# Patient Record
Sex: Male | Born: 1985 | Hispanic: No | Marital: Single | Smoking: Never smoker
Health system: Southern US, Community
[De-identification: ages and names within clinical notes are randomized; demographics above are authoritative.]

## PROBLEM LIST (undated history)

## (undated) ENCOUNTER — Ambulatory Visit: Payer: Self-pay | Source: Home / Self Care

---

## 2008-11-22 ENCOUNTER — Emergency Department (HOSPITAL_COMMUNITY): Admission: EM | Admit: 2008-11-22 | Discharge: 2008-11-22 | Payer: Self-pay | Admitting: Emergency Medicine

## 2008-11-24 ENCOUNTER — Emergency Department (HOSPITAL_COMMUNITY): Admission: EM | Admit: 2008-11-24 | Discharge: 2008-11-24 | Payer: Self-pay | Admitting: Emergency Medicine

## 2008-12-10 ENCOUNTER — Emergency Department (HOSPITAL_COMMUNITY): Admission: EM | Admit: 2008-12-10 | Discharge: 2008-12-10 | Payer: Self-pay | Admitting: Emergency Medicine

## 2009-12-20 IMAGING — CR DG CHEST 2V
2 series · 2 of 2 positions shown · non-contrast
Comparison: None

CLINICAL DATA: Cough, difficulty sleeping.

CHEST - 2 VIEW

[w chest pa]
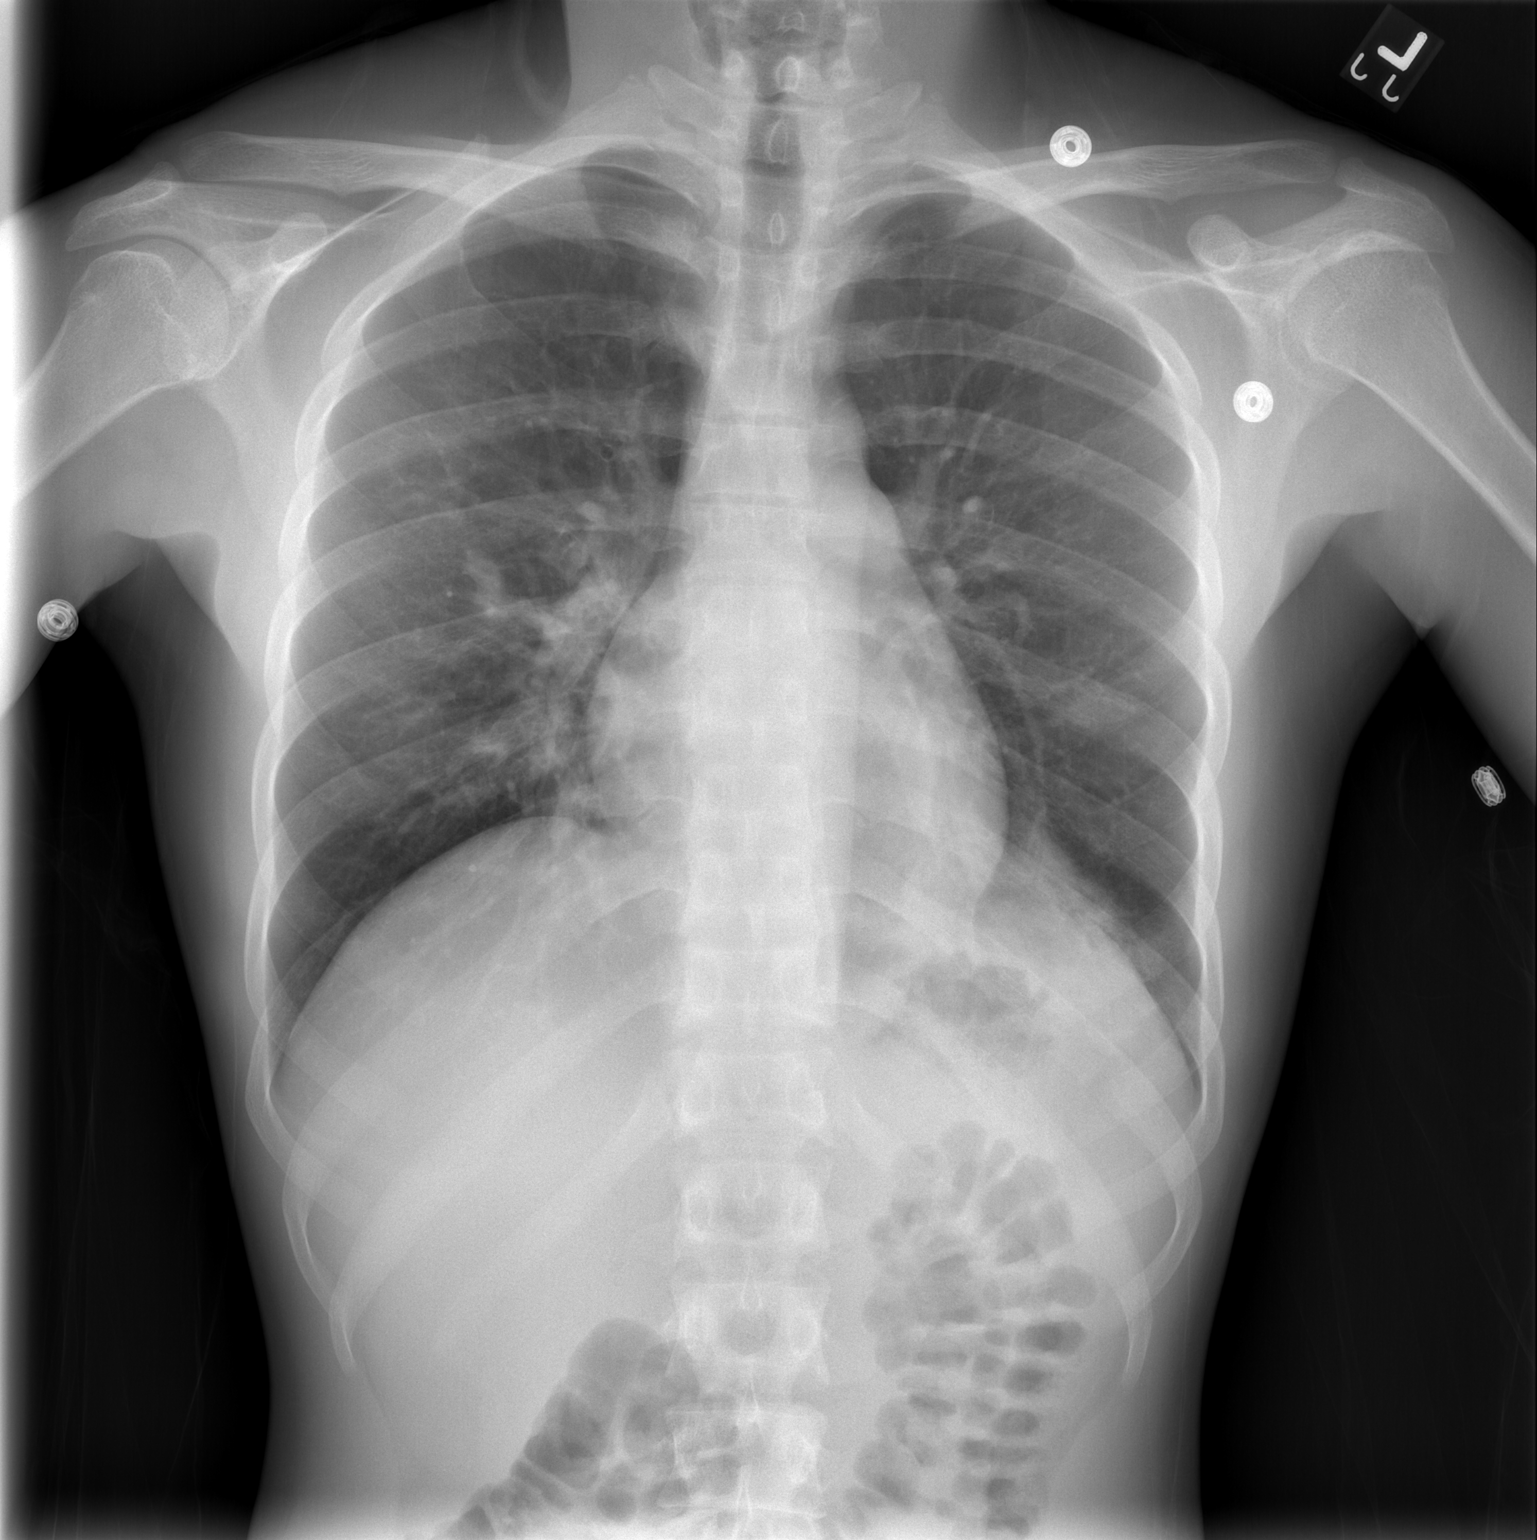

[w chest lat]
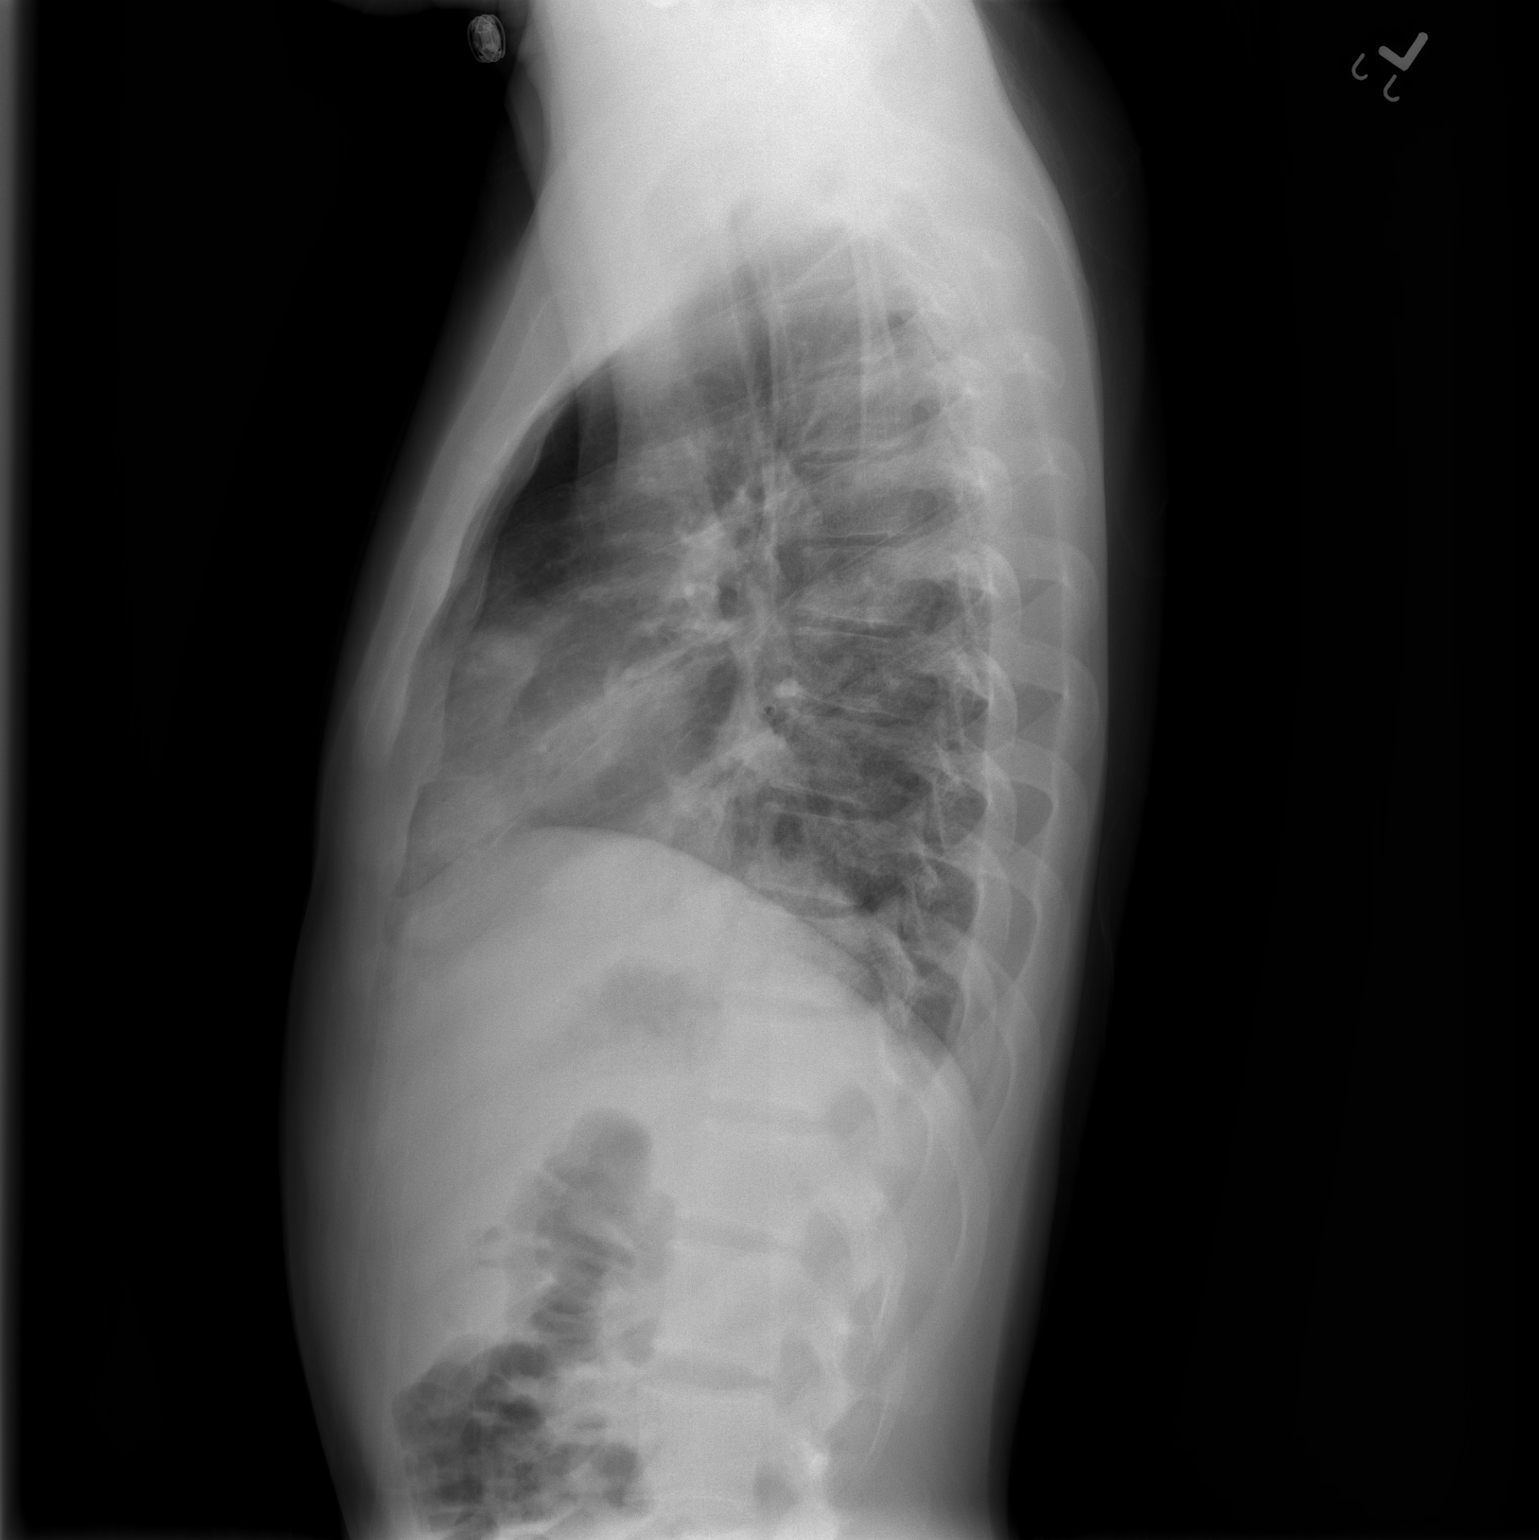

[2 of 2 positions shown; findings below may reference images not displayed]

FINDINGS: Air space disease is seen in the lower lung zones
bilaterally, and appears worst in the left lower lobe.  Lungs
otherwise clear.  No pleural fluid.  Visualized upper abdomen
unremarkable.
IMPRESSION: Pulmonary parenchymal findings are most consistent with bibasilar
pneumonia.

## 2010-04-23 ENCOUNTER — Inpatient Hospital Stay (HOSPITAL_COMMUNITY): Admission: RE | Admit: 2010-04-23 | Discharge: 2010-04-29 | Payer: Self-pay | Admitting: Psychiatry

## 2010-04-23 ENCOUNTER — Ambulatory Visit: Payer: Self-pay | Admitting: Psychiatry

## 2010-04-23 ENCOUNTER — Emergency Department (HOSPITAL_COMMUNITY): Admission: EM | Admit: 2010-04-23 | Discharge: 2010-04-23 | Payer: Self-pay | Admitting: Emergency Medicine

## 2010-05-08 ENCOUNTER — Emergency Department (HOSPITAL_COMMUNITY): Admission: EM | Admit: 2010-05-08 | Discharge: 2010-05-11 | Payer: Self-pay | Admitting: Emergency Medicine

## 2010-05-30 ENCOUNTER — Ambulatory Visit: Payer: Self-pay | Admitting: Psychiatry

## 2011-02-12 LAB — POCT I-STAT, CHEM 8
Chloride: 106 mEq/L (ref 96–112)
Glucose, Bld: 97 mg/dL (ref 70–99)
HCT: 51 % (ref 39.0–52.0)
Hemoglobin: 17.3 g/dL — ABNORMAL HIGH (ref 13.0–17.0)
Potassium: 3.7 mEq/L (ref 3.5–5.1)
Sodium: 140 mEq/L (ref 135–145)

## 2011-02-12 LAB — URINALYSIS, ROUTINE W REFLEX MICROSCOPIC
Bilirubin Urine: NEGATIVE
Nitrite: NEGATIVE
Specific Gravity, Urine: 1.018 (ref 1.005–1.030)
Urobilinogen, UA: 1 mg/dL (ref 0.0–1.0)

## 2011-02-12 LAB — RAPID URINE DRUG SCREEN, HOSP PERFORMED
Amphetamines: NOT DETECTED
Amphetamines: NOT DETECTED
Barbiturates: NOT DETECTED
Opiates: NOT DETECTED
Tetrahydrocannabinol: NOT DETECTED

## 2011-02-12 LAB — HEPATIC FUNCTION PANEL
ALT: 18 U/L (ref 0–53)
AST: 32 U/L (ref 0–37)
Bilirubin, Direct: 0.1 mg/dL (ref 0.0–0.3)
Total Bilirubin: 1.1 mg/dL (ref 0.3–1.2)

## 2011-02-12 LAB — ETHANOL: Alcohol, Ethyl (B): <5 mg/dL (ref 0–10)

## 2011-02-12 LAB — BASIC METABOLIC PANEL
CO2: 28 mEq/L (ref 19–32)
Calcium: 9.6 mg/dL (ref 8.4–10.5)
Creatinine, Ser: 1.18 mg/dL (ref 0.4–1.5)
GFR calc Af Amer: >60 mL/min (ref >60)
GFR calc non Af Amer: >60 mL/min (ref >60)

## 2011-02-12 LAB — CBC
MCHC: 33.9 g/dL (ref 30.0–36.0)
RBC: 4.96 MIL/uL (ref 4.22–5.81)
RDW: 11.9 % (ref 11.5–15.5)

## 2011-08-31 LAB — HEPATIC FUNCTION PANEL
Indirect Bilirubin: 1.1 mg/dL — ABNORMAL HIGH (ref 0.3–0.9)
Total Protein: 7.3 g/dL (ref 6.0–8.3)

## 2011-08-31 LAB — CBC
HCT: 44.5 % (ref 39.0–52.0)
MCV: 94.5 fL (ref 78.0–100.0)
Platelets: 143 10*3/uL — ABNORMAL LOW (ref 150–400)
RDW: 12.1 % (ref 11.5–15.5)

## 2011-08-31 LAB — DIFFERENTIAL
Basophils Absolute: 0 10*3/uL (ref 0.0–0.1)
Eosinophils Absolute: 0 10*3/uL (ref 0.0–0.7)
Eosinophils Relative: 0 % (ref 0–5)

## 2011-08-31 LAB — LIPASE, BLOOD: Lipase: 19 U/L (ref 11–59)

## 2012-06-30 ENCOUNTER — Emergency Department (HOSPITAL_COMMUNITY)
Admission: EM | Admit: 2012-06-30 | Discharge: 2012-06-30 | Disposition: A | Discharge status: Home / Self Care | Payer: Self-pay | Attending: Emergency Medicine | Admitting: Emergency Medicine

## 2012-06-30 ENCOUNTER — Encounter (HOSPITAL_COMMUNITY): Payer: Self-pay | Admitting: *Deleted

## 2012-06-30 DIAGNOSIS — J342 Deviated nasal septum: Secondary | ICD-10-CM | POA: Insufficient documentation

## 2012-06-30 MED ORDER — IBUPROFEN 800 MG PO TABS: 800.0000 mg | ORAL_TABLET | Freq: Three times a day (TID) | ORAL | Status: AC | PRN
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
## 2012-07-01 NOTE — ED Provider Notes (Signed)
Medical screening examination/treatment/procedure(s) were performed by non-physician practitioner and as supervising physician I was immediately available for consultation/collaboration.  Sana Tessmer, MD 07/01/12 1252 

## 2014-01-17 ENCOUNTER — Emergency Department (HOSPITAL_COMMUNITY)
Admission: EM | Admit: 2014-01-17 | Discharge: 2014-01-17 | Disposition: A | Discharge status: Home / Self Care | Payer: Self-pay | Attending: Emergency Medicine | Admitting: Emergency Medicine

## 2014-01-17 ENCOUNTER — Encounter (HOSPITAL_COMMUNITY): Payer: Self-pay | Admitting: Emergency Medicine

## 2014-01-17 DIAGNOSIS — Z79899 Other long term (current) drug therapy: Secondary | ICD-10-CM | POA: Insufficient documentation

## 2014-01-17 DIAGNOSIS — R21 Rash and other nonspecific skin eruption: Secondary | ICD-10-CM | POA: Insufficient documentation

## 2014-01-17 DIAGNOSIS — A64 Unspecified sexually transmitted disease: Secondary | ICD-10-CM

## 2014-01-17 DIAGNOSIS — J029 Acute pharyngitis, unspecified: Secondary | ICD-10-CM | POA: Insufficient documentation

## 2014-01-17 DIAGNOSIS — R197 Diarrhea, unspecified: Secondary | ICD-10-CM | POA: Insufficient documentation

## 2014-01-17 DIAGNOSIS — A510 Primary genital syphilis: Secondary | ICD-10-CM | POA: Insufficient documentation

## 2014-01-17 LAB — RPR: RPR Ser Ql: NONREACTIVE

## 2014-01-17 MED ORDER — AZITHROMYCIN 250 MG PO TABS
1000.0000 mg | ORAL_TABLET | Freq: Once | ORAL | Status: AC
  Administered 2014-01-17: 1000 mg via ORAL
  Filled 2014-01-17: qty 4

## 2014-01-17 MED ORDER — METRONIDAZOLE 500 MG PO TABS
2000.0000 mg | ORAL_TABLET | Freq: Once | ORAL | Status: AC
  Administered 2014-01-17: 2000 mg via ORAL
  Filled 2014-01-17: qty 4

## 2014-01-17 MED ORDER — PENICILLIN G BENZATHINE 1200000 UNIT/2ML IM SUSP
2.4000 10*6.[IU] | Freq: Once | INTRAMUSCULAR | Status: AC
  Administered 2014-01-17: 2.4 10*6.[IU] via INTRAMUSCULAR
  Filled 2014-01-17: qty 4

## 2014-01-17 MED ORDER — CEFIXIME 400 MG PO TABS
400.0000 mg | ORAL_TABLET | Freq: Once | ORAL | Status: AC
  Administered 2014-01-17: 400 mg via ORAL
  Filled 2014-01-17: qty 1

## 2014-01-17 NOTE — ED Notes (Signed)
Pt reports hx of unprotected sex, pt would like lab work, thinks he might have an infection.

## 2014-01-17 NOTE — ED Provider Notes (Signed)
Medical screening examination/treatment/procedure(s) were performed by non-physician practitioner and as supervising physician I was immediately available for consultation/collaboration.   Tukker Byrns T Korrey Schleicher, MD 01/17/14 1535 

## 2014-01-17 NOTE — ED Notes (Signed)
Pt given sandwich to take with medications.

## 2014-01-17 NOTE — ED Notes (Signed)
Pt states he has unprotected sex with women and is afraid he has been exposed to HIV.  Pt believes he is having s/s associated with HIV.  Pt's tongue is coated in white and he has a rash on his right wrist.  Pt also states he is having penile discharge and has a raw place on his penis from masturbating excessively.  Pt also reports some diarrhea.

## 2014-01-17 NOTE — ED Provider Notes (Signed)
CSN: [id number]     Arrival date & time 01/17/14  1302 History  This chart was scribed for non-physician practitioner  Carlyle Dollyhristopher W. Quante Pettry, PA-C, working with Toy BakerAnthony T Allen, MD, by Yevette EdwardsAngela Bracken, ED Scribe. This patient was seen in room WTR9/WTR9 and the patient's care was started at 1:16 PM.   First MD Initiated Contact with Patient 01/17/14 1310     Chief Complaint  Patient presents with  . SEXUALLY TRANSMITTED DISEASE    The history is provided by the patient. No language interpreter was used.   HPI Comments: Scott Friarung Fortune is a 28 y.o. male who presents to the Emergency Department complaining of a possible sexually transmitted infection. The pt has a h/o unprotected sexual intercourse with women. He has experienced an ostensible sore to his tongue and penile discharge yellow in color. He has also experienced a mild sore throat, a rash to his right hand, and mild diarrhea. The pt denies a fever.   No past medical history on file. No past surgical history on file. No family history on file. History  Substance Use Topics  . Smoking status: Never Smoker   . Smokeless tobacco: Not on file  . Alcohol Use: No    Review of Systems  A complete 10 system review of systems was obtained, and all systems were negative except where indicated in the HPI and PE.   Allergies  Review of patient's allergies indicates no known allergies.  Home Medications   Current Outpatient Rx  Name  Route  Sig  Dispense  Refill  . amphetamine-dextroamphetamine (ADDERALL) 20 MG tablet   Oral   Take 20 mg by mouth daily.          Triage Vitals: BP 123/74  Pulse 112  Temp(Src) 98.5 F (36.9 C) (Oral)  Resp 16  SpO2 98%  Physical Exam  Nursing note and vitals reviewed. Constitutional: He is oriented to person, place, and time. He appears well-developed and well-nourished. No distress.  HENT:  Head: Normocephalic and atraumatic.  Mouth/Throat: Oropharynx is clear and moist.  Tongue appeared  normal, though it looked like it needed to be cleaned.  Oropharynx clear.  Eyes: Conjunctivae and EOM are normal. Right eye exhibits no discharge. Left eye exhibits no discharge.  Neck: Normal range of motion. Neck supple. No tracheal deviation present.  Cardiovascular: Normal rate.   Pulmonary/Chest: Effort normal. No respiratory distress.  Genitourinary: Testes normal.    Uncircumcised.  Musculoskeletal: Normal range of motion.  Neurological: He is alert and oriented to person, place, and time.  Skin: Skin is warm and dry. Rash noted.  Dry, scaly rash on dorsum of hands. Palms are clear.   Psychiatric: He has a normal mood and affect. His behavior is normal.    ED Course  Procedures (including critical care time)  DIAGNOSTIC STUDIES: Oxygen Saturation is 98% on room air, normal by my interpretation.    COORDINATION OF CARE:  1:18 PM- Discussed treatment plan with patient, which includes a culture and prophylactic treatment, and the patient agreed to the plan.   1:44 PM- Cultured pt's penile discharge.  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  Patient also be treated for gonorrhea, Chlamydia, along with trichomonas.  Patient is educated on safe sex practices.  Carlyle Dollyhristopher W Shon Mansouri, PA-C 01/17/14 [phone]

## 2014-01-17 NOTE — Discharge Instructions (Signed)
Return here as needed.  Follow-up with the health department °

## 2014-01-18 LAB — GC/CHLAMYDIA PROBE AMP
CT Probe RNA: NEGATIVE
GC Probe RNA: NEGATIVE

## 2014-01-22 ENCOUNTER — Emergency Department (HOSPITAL_COMMUNITY)
Admission: EM | Admit: 2014-01-22 | Discharge: 2014-01-22 | Disposition: A | Discharge status: Home / Self Care | Payer: Self-pay | Attending: Emergency Medicine | Admitting: Emergency Medicine

## 2014-01-22 ENCOUNTER — Encounter (HOSPITAL_COMMUNITY): Payer: Self-pay | Admitting: Emergency Medicine

## 2014-01-22 ENCOUNTER — Telehealth (HOSPITAL_COMMUNITY): Payer: Self-pay

## 2014-01-22 DIAGNOSIS — Z202 Contact with and (suspected) exposure to infections with a predominantly sexual mode of transmission: Secondary | ICD-10-CM | POA: Insufficient documentation

## 2014-01-22 DIAGNOSIS — R21 Rash and other nonspecific skin eruption: Secondary | ICD-10-CM | POA: Insufficient documentation

## 2014-01-22 DIAGNOSIS — Z79899 Other long term (current) drug therapy: Secondary | ICD-10-CM | POA: Insufficient documentation

## 2014-01-22 MED ORDER — ACYCLOVIR 400 MG PO TABS: 400.0000 mg | ORAL_TABLET | Freq: Four times a day (QID) | ORAL | Status: DC

## 2014-01-22 MED ORDER — NYSTATIN 100000 UNIT/GM EX CREA: TOPICAL_CREAM | CUTANEOUS | Status: DC

## 2014-01-22 NOTE — ED Notes (Signed)
Pt ambulatory to exam room with steady gait.  

## 2014-01-22 NOTE — ED Provider Notes (Signed)
Medical screening examination/treatment/procedure(s) were performed by non-physician practitioner and as supervising physician I was immediately available for consultation/collaboration.    Celene KrasJon R Makenlee Mckeag, MD 01/22/14 2124

## 2014-01-22 NOTE — ED Notes (Addendum)
Pt states, "about an hour ago, while masturbating I noticed blood." Pt states the blood was on the tip of his penis. Pt was seen on 01/17/2014 for possible STD exposure. Pt is A/O x4, in NAD, and vitals are WDL.
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
## 2014-01-22 NOTE — Discharge Instructions (Signed)
[facility] and [facility]. Avoid any sexual intercourse or masturbation for the next 2 weeks. Keep your skin clean and dry.   Herpes Simplex Herpes simplex is generally classified as Type 1 or Type 2. Type 1 is generally the type that is responsible for cold sores. Type 2 is generally associated with sexually transmitted diseases. We now know that most of the thoughts on these viruses are inaccurate. We find that HSV1 is also present genitally and HSV2 can be present orally, but this will vary in different locations of the world. Herpes simplex is usually detected by doing a culture. Blood tests are also available for this virus; however, the accuracy is often not as good.  PREPARATION FOR TEST No preparation or fasting is necessary. NORMAL FINDINGS  No virus present  No HSV antigens or antibodies present Ranges for normal findings may vary among different laboratories and hospitals. You should always check with your doctor after having lab work or other tests done to discuss the meaning of your test results and whether your values are considered within normal limits. MEANING OF TEST  Your caregiver will go over the test results with you and discuss the importance and meaning of your results, as well as treatment options and the need for additional tests if necessary. OBTAINING THE TEST RESULTS  It is your responsibility to obtain your test results. Ask the lab or department performing the test when and how you will get your results. Document Released: 12/15/2004 Document Revised: 02/04/2012 Document Reviewed: 10/23/2008 [facility] Patient Information 2014 [place], [place].

## 2014-01-22 NOTE — ED Notes (Signed)
Pt calling for STD results.  ID verified x 2.  Pt informed Gonorrhea and Chlamydia were both negative and RPR was non reactive.

## 2014-05-10 ENCOUNTER — Emergency Department (HOSPITAL_COMMUNITY)
Admission: EM | Admit: 2014-05-10 | Discharge: 2014-05-10 | Disposition: A | Discharge status: Home / Self Care | Payer: Self-pay | Attending: Emergency Medicine | Admitting: Emergency Medicine

## 2014-05-10 ENCOUNTER — Encounter (HOSPITAL_COMMUNITY): Payer: Self-pay | Admitting: Emergency Medicine

## 2014-05-10 DIAGNOSIS — Z043 Encounter for examination and observation following other accident: Secondary | ICD-10-CM | POA: Insufficient documentation

## 2014-05-10 DIAGNOSIS — Y9389 Activity, other specified: Secondary | ICD-10-CM | POA: Insufficient documentation

## 2014-05-10 DIAGNOSIS — Z79899 Other long term (current) drug therapy: Secondary | ICD-10-CM | POA: Insufficient documentation

## 2014-05-10 DIAGNOSIS — F411 Generalized anxiety disorder: Secondary | ICD-10-CM | POA: Insufficient documentation

## 2014-05-10 DIAGNOSIS — Y92410 Unspecified street and highway as the place of occurrence of the external cause: Secondary | ICD-10-CM | POA: Insufficient documentation

## 2014-05-10 DIAGNOSIS — R42 Dizziness and giddiness: Secondary | ICD-10-CM | POA: Insufficient documentation
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
## 2014-05-11 NOTE — ED Provider Notes (Signed)
Medical screening examination/treatment/procedure(s) were performed by non-physician practitioner and as supervising physician I was immediately available for consultation/collaboration.   EKG Interpretation None       Doug SouSam Tamani Durney, MD 05/11/14 [phone]

## 2014-05-23 ENCOUNTER — Encounter (HOSPITAL_COMMUNITY): Payer: Self-pay | Admitting: Emergency Medicine

## 2014-05-23 ENCOUNTER — Emergency Department (HOSPITAL_COMMUNITY)
Admission: EM | Admit: 2014-05-23 | Discharge: 2014-05-23 | Disposition: A | Discharge status: Home / Self Care | Payer: No Typology Code available for payment source | Attending: Emergency Medicine | Admitting: Emergency Medicine

## 2014-05-23 DIAGNOSIS — Q666 Other congenital valgus deformities of feet: Secondary | ICD-10-CM | POA: Insufficient documentation

## 2014-05-23 DIAGNOSIS — K625 Hemorrhage of anus and rectum: Secondary | ICD-10-CM | POA: Insufficient documentation

## 2014-05-23 DIAGNOSIS — R369 Urethral discharge, unspecified: Secondary | ICD-10-CM | POA: Insufficient documentation

## 2014-05-23 DIAGNOSIS — Z202 Contact with and (suspected) exposure to infections with a predominantly sexual mode of transmission: Secondary | ICD-10-CM

## 2014-05-23 DIAGNOSIS — F411 Generalized anxiety disorder: Secondary | ICD-10-CM | POA: Insufficient documentation

## 2014-05-23 DIAGNOSIS — K921 Melena: Secondary | ICD-10-CM | POA: Insufficient documentation

## 2014-05-23 LAB — URINALYSIS, ROUTINE W REFLEX MICROSCOPIC
Bilirubin Urine: NEGATIVE
Glucose, UA: NEGATIVE mg/dL
Hgb urine dipstick: NEGATIVE
Ketones, ur: NEGATIVE mg/dL
LEUKOCYTES UA: NEGATIVE
NITRITE: NEGATIVE
Protein, ur: NEGATIVE mg/dL
SPECIFIC GRAVITY, URINE: 1.021 (ref 1.005–1.030)
UROBILINOGEN UA: 0.2 mg/dL (ref 0.0–1.0)
pH: 7.5 (ref 5.0–8.0)

## 2014-05-23 LAB — CBC WITH DIFFERENTIAL/PLATELET
BASOS ABS: 0 10*3/uL (ref 0.0–0.1)
BASOS PCT: 0 % (ref 0–1)
EOS ABS: 0 10*3/uL (ref 0.0–0.7)
Eosinophils Relative: 1 % (ref 0–5)
HCT: 48.8 % (ref 39.0–52.0)
Hemoglobin: 17.3 g/dL — ABNORMAL HIGH (ref 13.0–17.0)
Lymphocytes Relative: 18 % (ref 12–46)
Lymphs Abs: 1.1 10*3/uL (ref 0.7–4.0)
MCH: 32.2 pg (ref 26.0–34.0)
MCHC: 35.5 g/dL (ref 30.0–36.0)
MCV: 90.7 fL (ref 78.0–100.0)
MONOS PCT: 6 % (ref 3–12)
Monocytes Absolute: 0.4 10*3/uL (ref 0.1–1.0)
NEUTROS ABS: 4.5 10*3/uL (ref 1.7–7.7)
NEUTROS PCT: 75 % (ref 43–77)
PLATELETS: 177 10*3/uL (ref 150–400)
RBC: 5.38 MIL/uL (ref 4.22–5.81)
RDW: 11 % — AB (ref 11.5–15.5)
WBC: 6 10*3/uL (ref 4.0–10.5)

## 2014-05-23 LAB — POC OCCULT BLOOD, ED: Fecal Occult Bld: POSITIVE — AB

## 2014-05-23 LAB — BASIC METABOLIC PANEL
BUN: 14 mg/dL (ref 6–23)
CHLORIDE: 100 meq/L (ref 96–112)
CO2: 27 mEq/L (ref 19–32)
Calcium: 9.2 mg/dL (ref 8.4–10.5)
Creatinine, Ser: 1.02 mg/dL (ref 0.50–1.35)
Glucose, Bld: 91 mg/dL (ref 70–99)
POTASSIUM: 4.1 meq/L (ref 3.7–5.3)
SODIUM: 141 meq/L (ref 137–147)

## 2014-05-23 LAB — RPR

## 2014-05-23 LAB — RAPID HIV SCREEN (WH-MAU): Rapid HIV Screen: NONREACTIVE

## 2014-05-23 MED ORDER — LIDOCAINE HCL 1 % IJ SOLN
INTRAMUSCULAR | Status: AC
  Administered 2014-05-23: 1.6 mL
  Filled 2014-05-23: qty 20

## 2014-05-23 MED ORDER — CEFTRIAXONE SODIUM 250 MG IJ SOLR
125.0000 mg | Freq: Once | INTRAMUSCULAR | Status: AC
  Administered 2014-05-23: 125 mg via INTRAMUSCULAR
  Filled 2014-05-23: qty 250

## 2014-05-23 MED ORDER — AZITHROMYCIN 250 MG PO TABS
1000.0000 mg | ORAL_TABLET | Freq: Once | ORAL | Status: AC
  Administered 2014-05-23: 1000 mg via ORAL
  Filled 2014-05-23: qty 4

## 2014-05-23 MED ORDER — METRONIDAZOLE 500 MG PO TABS
2000.0000 mg | ORAL_TABLET | Freq: Once | ORAL | Status: AC
  Administered 2014-05-23: 2000 mg via ORAL
  Filled 2014-05-23: qty 4

## 2014-05-23 NOTE — ED Provider Notes (Signed)
CSN: [id number]     Arrival date & time 05/23/14  1241 History   First MD Initiated Contact with Patient 05/23/14 1353     No chief complaint on file.    (Consider location/radiation/quality/duration/timing/severity/associated sxs/prior Treatment) The history is provided by the patient.   28 year old male presents emergency department with the complaint of rectal bleeding. Is also concerned about the AP now discharged an STD exposures. Patient is anxious but alert and oriented. No suicidal or homicidal ideation. Patient is also concerned about having HIV. Patient is seeing blood with on the colon paper with wiping. There hasn't been blood in the water. Patient describes a whitish penile discharge. Denies any pain or lesions.  History reviewed. No pertinent past medical history. History reviewed. No pertinent past surgical history. History reviewed. No pertinent family history. History  Substance Use Topics  . Smoking status: Never Smoker   . Smokeless tobacco: Never Used  . Alcohol Use: No    Review of Systems  Constitutional: Negative for fever.  HENT: Negative for congestion and sore throat.   Eyes: Negative for visual disturbance.  Respiratory: Negative for shortness of breath.   Cardiovascular: Negative for chest pain.  Gastrointestinal: Positive for blood in stool and anal bleeding. Negative for nausea, vomiting and abdominal pain.  Genitourinary: Positive for discharge. Negative for dysuria, scrotal swelling, penile pain and testicular pain.  Musculoskeletal: Negative for back pain.  Skin: Negative for rash.  Allergic/Immunologic: Negative for immunocompromised state.  Neurological: Negative for headaches.  Hematological: Does not bruise/bleed easily.  Psychiatric/Behavioral: Negative for confusion.      Allergies  Review of patient's allergies indicates no known allergies.  Home Medications   Prior to Admission medications   Not on File   BP 120/70  Pulse 84   Temp(Src) 98.3 F (36.8 C) (Oral)  Resp 18  SpO2 100% Physical Exam  Nursing note and vitals reviewed. Constitutional: He is oriented to person, place, and time. He appears well-developed and well-nourished. No distress.  HENT:  Head: Normocephalic and atraumatic.  Mouth/Throat: Oropharynx is clear and moist.  Eyes: Conjunctivae and EOM are normal. Pupils are equal, round, and reactive to light.  Neck: Normal range of motion. Neck supple.  Cardiovascular: Normal rate, regular rhythm and normal heart sounds.   Pulmonary/Chest: Effort normal and breath sounds normal. No respiratory distress.  Abdominal: Soft. Bowel sounds are normal. There is no tenderness.  Genitourinary: Prostate normal and penis normal. Guaiac positive stool.   Rectal exam without any evidence of gross blood anal fissure external hemorrhoids are prolapsed internal hemorrhoids. No mass. Prostate normal.   patient without evidence of hernia. The penis is normal without any discharge whatsoever patient is uncircumcised. No testicular mass or tenderness. No groin hernias. No penile lesions.  Musculoskeletal: Normal range of motion. He exhibits no edema.  Right knee notable for a valgus deformity.  Lymphadenopathy:    He has no cervical adenopathy.  Neurological: He is alert and oriented to person, place, and time. No cranial nerve deficit. He exhibits normal muscle tone. Coordination normal.  Skin: Skin is warm. No rash noted.    ED Course  Procedures (including critical care time) Labs Review Labs Reviewed  CBC WITH DIFFERENTIAL - Abnormal; Notable for the following:    Hemoglobin 17.3 (*)    RDW 11.0 (*)    All other components within normal limits  POC OCCULT BLOOD, ED - Abnormal; Notable for the following:    Fecal Occult Bld POSITIVE (*)  All other components within normal limits  GC/CHLAMYDIA PROBE AMP  BASIC METABOLIC PANEL  URINALYSIS, ROUTINE W REFLEX MICROSCOPIC  RAPID HIV SCREEN ([facility])  RPR   HIV ANTIBODY (ROUTINE TESTING)   Results for orders placed during the hospital encounter of 05/23/14  CBC WITH DIFFERENTIAL      Result Value Ref Range   WBC 6.0  4.0 - 10.5 K/uL   RBC 5.38  4.22 - 5.81 MIL/uL   Hemoglobin 17.3 (*) 13.0 - 17.0 g/dL   HCT 16.1  09.6 - 04.5 %   MCV 90.7  78.0 - 100.0 fL   MCH 32.2  26.0 - 34.0 pg   MCHC 35.5  30.0 - 36.0 g/dL   RDW 40.9 (*) 81.1 - 91.4 %   Platelets 177  150 - 400 K/uL   Neutrophils Relative % 75  43 - 77 %   Neutro Abs 4.5  1.7 - 7.7 K/uL   Lymphocytes Relative 18  12 - 46 %   Lymphs Abs 1.1  0.7 - 4.0 K/uL   Monocytes Relative 6  3 - 12 %   Monocytes Absolute 0.4  0.1 - 1.0 K/uL   Eosinophils Relative 1  0 - 5 %   Eosinophils Absolute 0.0  0.0 - 0.7 K/uL   Basophils Relative 0  0 - 1 %   Basophils Absolute 0.0  0.0 - 0.1 K/uL  BASIC METABOLIC PANEL      Result Value Ref Range   Sodium 141  137 - 147 mEq/L   Potassium 4.1  3.7 - 5.3 mEq/L   Chloride 100  96 - 112 mEq/L   CO2 27  19 - 32 mEq/L   Glucose, Bld 91  70 - 99 mg/dL   BUN 14  6 - 23 mg/dL   Creatinine, Ser 7.82  0.50 - 1.35 mg/dL   Calcium 9.2  8.4 - 95.6 mg/dL   GFR calc non Af Amer >90  >90 mL/min   GFR calc Af Amer >90  >90 mL/min  URINALYSIS, ROUTINE W REFLEX MICROSCOPIC      Result Value Ref Range   Color, Urine YELLOW  YELLOW   APPearance CLEAR  CLEAR   Specific Gravity, Urine 1.021  1.005 - 1.030   pH 7.5  5.0 - 8.0   Glucose, UA NEGATIVE  NEGATIVE mg/dL   Hgb urine dipstick NEGATIVE  NEGATIVE   Bilirubin Urine NEGATIVE  NEGATIVE   Ketones, ur NEGATIVE  NEGATIVE mg/dL   Protein, ur NEGATIVE  NEGATIVE mg/dL   Urobilinogen, UA 0.2  0.0 - 1.0 mg/dL   Nitrite NEGATIVE  NEGATIVE   Leukocytes, UA NEGATIVE  NEGATIVE  RAPID HIV SCREEN ([facility]-MAU)      Result Value Ref Range   SUDS Rapid HIV Screen NON REACTIVE  NON REACTIVE  POC OCCULT BLOOD, ED      Result Value Ref Range   Fecal Occult Bld POSITIVE (*) NEGATIVE     Imaging Review No results  found.   EKG Interpretation None      MDM   Final diagnoses:  Rectal bleeding  STD exposure    Patient treated for STD exposure. On physical exam no evidence of STD. No urethral discharge. Patient historically says he's had some. HIV was negative. Patient said he had the red blood the parenchyma on rectal exam no gross blood. However microscopic blood was present on the Hemoccult. No evidence of any anal fissure or rectal mass.  Patient treated with Rocephin Zithromax and  Flagyl while in the emergency department. Patient given referral for followup for the rectal bleeding.    Vanetta MuldersScott Penny Arrambide, MD 05/23/14 1600

## 2014-05-23 NOTE — ED Notes (Signed)
Patient is alert and oriented x3.  He is complaining of blood in his stool that he states  Started last week.  He is complaining of rectal pain with his bowel movements. Patient  Seems agitated and confused with questions and is unable to answer questions.

## 2014-05-23 NOTE — ED Notes (Signed)
Patient also reports that he has had multiple sexual partners recently and is afraid he has an STD. Patient also states that he has not been taking his depression medicine and that he sees people who are not there when he drives.

## 2014-05-23 NOTE — Discharge Instructions (Signed)
Bloody Stools Bloody stools means there is blood in your poop (stool). It is a sign that there is a problem somewhere in the digestive system. It is important for your doctor to find the cause of your bleeding, so the problem can be treated.  HOME CARE  Only take medicine as told by your doctor.  Eat foods with fiber (prunes, bran cereals).  Drink enough fluids to keep your pee (urine) clear or pale yellow.  Sit in warm water (sitz bath) for 10 to 15 minutes as told by your doctor.  Know how to take your medicines (enemas, suppositories) if advised by your doctor.  Watch for signs that you are getting better or getting worse. GET HELP RIGHT AWAY IF:   You are not getting better.  You start to get better but then get worse again.  You have new problems.  You have severe bleeding from the place where poop comes out (rectum) that does not stop.  You throw up (vomit) blood.  You feel weak or pass out (faint).  You have a fever. MAKE SURE YOU:   Understand these instructions.  Will watch your condition.  Will get help right away if you are not doing well or get worse. Document Released: 10/31/2009 Document Revised: 02/04/2012 Document Reviewed: 03/30/2011 [facility] Patient Information 2015 [place], [place]. This information is not intended to replace advice given to you by your health care provider. Make sure you discuss any questions you have with your health care provider.   Emergency Department Resource Guide 1) Find a Doctor and Pay Out of Pocket Although you won't have to find out who is covered by your insurance plan, it is a good idea to ask around and get recommendations. You will then need to call the office and see if the doctor you have chosen will accept you as a new patient and what types of options they offer for patients who are self-pay. Some doctors offer discounts or will set up payment plans for their patients who do not have insurance, but you will need to ask  so you aren't surprised when you get to your appointment.  2) Contact Your Local Health Department Not all health departments have doctors that can see patients for sick visits, but many do, so it is worth a call to see if yours does. If you don't know where your local health department is, you can check in your phone book. The CDC also has a tool to help you locate your state's health department, and many state websites also have listings of all of their local health departments.  3) Find a Walk-in Clinic If your illness is not likely to be very severe or complicated, you may want to try a walk in clinic. These are popping up all over the country in pharmacies, drugstores, and shopping centers. They're usually staffed by nurse practitioners or physician assistants that have been trained to treat common illnesses and complaints. They're usually fairly quick and inexpensive. However, if you have serious medical issues or chronic medical problems, these are probably not your best option.  No Primary Care Doctor: - Call Health Connect at  [phone] - they can help you locate a primary care doctor that  accepts your insurance, provides certain services, etc. - Physician Referral Service- [phone]  Chronic Pain Problems: Organization         Address  Phone   Notes  [facility]  [phone] Patients need to be referred by  their primary care doctor.   Medication Assistance: Organization         Address  Phone   Notes  [facility] Medication [facility] [address] [place], [place] [address] [phone](336) [phone] --Must be a resident of [facility] [place] -- Must have NO insurance coverage whatsoever (no Medicaid/ Medicare, etc.) -- The pt. MUST have a primary care doctor that directs their care regularly and follows them in the community   MedAssist  [phone]   [employer]  [phone]    Agencies that provide inexpensive medical  care: Organization         Address  Phone   Notes  [facility]  [phone]   [facility]    [phone]   [facility] [address] [place], [place] [address] [phone]   [facility] [place] [address], [place] [phone]) [phone]   [facility]    [phone]) [phone]   [facility]    [phone]   [facility]  [address], [place] Phone:  [phone], Fax:  [phone](336) [phone] Hours of Operation:  9 am - 6 pm, M-F.  Also accepts Medicaid/Medicare and self-pay.  [facility]  [address], [place] Phone: [phone], Fax: [phone](336) [phone]. Hours of Operation:  8:30 am - 5:30 pm, M-F.  Also accepts Medicaid and self-pay.  [facility] [place] [address], [place] Phone: [phone]) [phone]   [facility] [address], [place], [place] [phone], Ext. [phone] Mondays & Thursdays: 7-9 AM.  First 15 patients are seen on a first come, first serve basis.    Medicaid-accepting [facility] Providers:  Organization         Address  Phone   Notes  [facility] [address], [place] [phone]) [phone] Also accepts self-pay patients.  [facility] [address], [place]  [phone]) [phone]   [facility] [address], [place] [phone]) [phone]   [facility] [address] [place] [address], [place] [phone]) [phone]   Renaye RakersVeita Bland [address], [place]   [phone] Only accepts [place] Access [place] patients after they have their name applied to their card.   Self-Pay (no insurance) in [facility] [place]:  Organization         Address  Phone   Notes  Sickle Cell Patients, [facility] [address], [place] [phone]) [phone]   [facility] [address],  [place] [phone]) [phone]   [facility] [place]  [address], [place] [phone]) [phone]   [facility]/Dr. Osei-Bonsu  [address] [place] [address], [place] or [phone], [place] [phone] Phone number for both [place] and [place] locations is the same.  [facility] [address], [place] [phone](336) [phone]   [facility] [place] [address] [place] [address], [place] or [address] [phone]   [facility] [address], [place] [phone]) [phone], phone; [phone]) [phone], fax Sees patients 1st and 3rd Saturday of every month.  Must not qualify for public or private insurance (i.e. Medicaid, Medicare, [place] Health Choice, Veterans' Benefits)  Household income should be no more than  200% of the poverty level The clinic cannot treat you if you are pregnant or think you are pregnant  Sexually transmitted diseases are not treated at the clinic.    Dental Care: Organization         Address  Phone  Notes  [facility] [address] [place], [place] [phone] Accepts children up to age 65 who are enrolled in [place] or [place] Health Choice; pregnant women with a Medicaid card; and children who have applied for Medicaid or [place] Health Choice, but were declined, whose parents can pay a reduced fee at time of service.  [facility]  [address], [place] [phone] Accepts children up to age 58 who are enrolled in [place] or [place] Health Choice; pregnant women with a Medicaid card; and children who have applied for Medicaid or [place] Health Choice, but were declined, whose parents can pay a reduced fee at time of service.  [facility]  [address] [place], [place] [phone] Patients are seen by appointment only. Walk-ins are not accepted. [facility] will see patients 40 years of age and older. Monday - Tuesday (8am-5pm) Most Wednesdays (8:30-5pm) $30 per visit, cash only  [facility]  [address], [facility] [phone] Patients are seen by appointment only. Walk-ins are not accepted. [facility] will see patients 32 years of age and older. One Wednesday Evening (Monthly: Volunteer Based).  $30 per visit, cash only  [employer]  [phone] for adults; Children under age 67, call Graduate Pediatric Dentistry at [phone]. Children aged 72-14, please call [phone] to request a pediatric application.  Dental services are provided in all areas of dental care including fillings, crowns and bridges, complete and partial dentures, implants, gum treatment, root canals, and extractions. Preventive care is also provided. Treatment is provided to both adults and children. Patients are selected via a lottery and there is often a waiting list.   [facility] [address] [place]  [phone] [email or web address]   [facility] [address] [place], [place] [phone], Ext. [phone] Second and Fourth Thursday of each month, opens at 6:30 AM; Clinic ends at 9 AM.  Patients are seen on a first-come first-served basis, and a limited number are seen during each clinic.   [facility]  [address] [place], [place] [phone]   Eligibility Requirements You must have lived in [place], [place], or [place] counties for at least the last three months.   You cannot be eligible for state or federal sponsored [place], including [employer], [place], or [employer].   You generally cannot be eligible for healthcare insurance through your employer.    How to apply: Eligibility screenings are held every Tuesday and Wednesday afternoon from 1:00 pm until 4:00 pm. You do not need an appointment for the interview!   [facility] [address], [place], [place] [phone]   [facility] Health Department  [phone]   [facility] Health Department  [phone]   [facility] Health Department  [phone]    Behavioral Health Resources in the Community: Intensive Outpatient Programs Organization         Address  Phone  Notes  [facility] [address] [place], [place] [phone]   [facility]  Outpatient [address], [place], [place] [phone]   [facility] [address] [place], [place]  [phone]   [facility] [address]  [place], [place] [phone] or [phone]   Substance Abuse Resources Organization         Address  Phone  Notes  [facility] Drug Services  [phone]   [facility]  [phone]   [facility] [place]  [phone]   Floydene Flock  [phone]   Residential & Outpatient Substance Abuse Program  [phone]   Psychological Services Organization         Address  Phone  Notes  [facility]  [phone]   [facility]  [phone]   [facility] [address], [place] [phone] or [phone]    Mobile Crisis Teams Organization         Address  Phone  Notes  [facility], Mobile Crisis Care Unit  [phone]   [facility]  [address] [place], [place] [phone]   Doristine Locks [address] [place] [phone]    Self-Help/Support Groups Organization         Address  Phone             Notes  Mental Health Assoc. of  - variety of support groups  336- I7437963 Call for more information  Narcotics Anonymous (NA), [facility] [address], [employer] [place]  2 meetings at this location   Statistician         Address  Phone  Notes  [facility] [address],    [place]  [phone]   [facility]  [address] [place] [address], [place], [place] [phone]   [facility] [address] [place], [place] [phone] Admissions: 8am-3pm M-F  [facility] [address]    [place], [place] [phone]   [facility] [address] [place], [place], [place] [phone]   [facility] [address]  [place], [place] [phone]   [facility] - Intensive Outpatient [address], [place], [place] [phone]   [facility] ([facility] [address]  [place], [place] [phone] or [phone]   [facility] ([facility]) [address] [place], [place] [phone] Accepts Medicaid  [facility] [place] [address]  [place] [phone] Substance Abuse/Addiction Treatment   [facility] Organization         Address  Phone  Notes  [facility]  [phone]   Angie Fava, PhD [address] [place], [place]   [phone] or [phone]   [facility] Behavioral   [address] [place], [place] [phone]   [facility] [address], [place], [place] [phone] Insurance/Medicaid/sponsorship through [facility] [address]                                    [place], [place] [phone] Therapy/tele-psych/case  [facility] [address], [place] [phone]    Dr. Lolly Mustache  [phone]   [facility]  [employer] [facility] Dept. 1) [address], [place] 2) [address], [place] 3)  [address], [place] [phone]) [phone]) [phone]  [phone](336) [phone]   [facility] Child Abuse Hotline [phone](336) [phone] or [phone](336) [phone] (After Hours)      Treatment for STD exposure has been provided for you in the emergency department. As we discussed at your  HIV screening was negative. No gross blood on rectal exam in your blood counts were normal. However there was some microscopic blood. Use resource guide above to help you find a primary care Dr. for further evaluation. May want to try the wellness Center on [address]. Return for worse bleeding or any new or worse symptoms.

## 2014-05-24 LAB — HIV ANTIBODY (ROUTINE TESTING W REFLEX): HIV 1&2 Ab, 4th Generation: NONREACTIVE

## 2016-12-10 ENCOUNTER — Emergency Department (HOSPITAL_COMMUNITY)
Admission: EM | Admit: 2016-12-10 | Discharge: 2016-12-10 | Disposition: A | Discharge status: Home / Self Care | Payer: Self-pay | Attending: Emergency Medicine | Admitting: Emergency Medicine

## 2016-12-10 ENCOUNTER — Encounter (HOSPITAL_COMMUNITY): Payer: Self-pay | Admitting: Emergency Medicine

## 2016-12-10 DIAGNOSIS — A64 Unspecified sexually transmitted disease: Secondary | ICD-10-CM | POA: Insufficient documentation

## 2016-12-10 LAB — URINALYSIS, ROUTINE W REFLEX MICROSCOPIC
BILIRUBIN URINE: NEGATIVE
Glucose, UA: NEGATIVE mg/dL
Hgb urine dipstick: NEGATIVE
KETONES UR: NEGATIVE mg/dL
LEUKOCYTES UA: NEGATIVE
Nitrite: NEGATIVE
PROTEIN: NEGATIVE mg/dL
Specific Gravity, Urine: 1.018 (ref 1.005–1.030)
pH: 6 (ref 5.0–8.0)

## 2016-12-10 MED ORDER — LIDOCAINE HCL 2 % IJ SOLN
INTRAMUSCULAR | Status: AC
  Filled 2016-12-10: qty 20

## 2016-12-10 MED ORDER — CEFTRIAXONE SODIUM 250 MG IJ SOLR
250.0000 mg | Freq: Once | INTRAMUSCULAR | Status: AC
  Administered 2016-12-10: 250 mg via INTRAMUSCULAR
  Filled 2016-12-10: qty 250

## 2016-12-10 MED ORDER — AZITHROMYCIN 250 MG PO TABS
1000.0000 mg | ORAL_TABLET | Freq: Once | ORAL | Status: AC
  Administered 2016-12-10: 1000 mg via ORAL
  Filled 2016-12-10: qty 4

## 2016-12-10 NOTE — ED Triage Notes (Signed)
Patient reports he wants to be checked to STDs. Denies dysuria. Reports white discharge.

## 2016-12-10 NOTE — ED Provider Notes (Signed)
[facility]-EMERGENCY DEPT Provider Note   CSN: [id number] Arrival date & time: 12/10/16  1557   By signing my name below, I, [name], attest that this documentation has been prepared under the direction and in the presence of Melburn Hake, [place]. Electronically Signed: [name], ED Scribe. 12/10/2016. 5:27 PM.   History   Chief Complaint Chief Complaint  Patient presents with  . STD check   The history is provided by the patient. No language interpreter was used.   HPI Comments:  Scott Escobar is a 31 y.o. male who presents to the Emergency Department, here for a full STD check. Pt complains of intermittent penile discharge x 2-3 months and intermittent dysuria. Pt reports having protected sex with multiple partners but notes sometimes he will have oral sex without protection. Pt reports concern for STD exposure and wants to be test for GC/Chlamydia, HIV and syphilis. Pt denies fever, abdominal pain, vomiting, rash, penile/tesiticluar pain or swelling, dysuria, oral lesions.    History reviewed. No pertinent past medical history.  There are no active problems to display for this patient.   History reviewed. No pertinent surgical history.     Home Medications    Prior to Admission medications   Not on File    Family History No family history on file.  Social History Social History  Substance Use Topics  . Smoking status: Never Smoker  . Smokeless tobacco: Never Used  . Alcohol use No     Allergies   Patient has no known allergies.   Review of Systems Review of Systems  Constitutional: Negative for fever.  HENT: Negative for mouth sores.   Gastrointestinal: Negative for abdominal pain and vomiting.  Genitourinary: Positive for discharge and dysuria. Negative for penile pain, penile swelling, scrotal swelling and testicular pain.  Skin: Negative for rash.  All other systems reviewed and are negative.    Physical Exam Updated Vital Signs BP  122/82 (BP Location: Left Arm)   Pulse 105   Temp 97.9 F (36.6 C) (Oral)   Resp 18   Ht 5\' 4"  (1.626 m)   Wt 63.5 kg   SpO2 100%   BMI 24.03 kg/m   Physical Exam  Constitutional: He appears well-developed and well-nourished. No distress.  HENT:  Head: Normocephalic and atraumatic.  Mouth/Throat: Uvula is midline, oropharynx is clear and moist and mucous membranes are normal. No oral lesions. No oropharyngeal exudate, posterior oropharyngeal edema, posterior oropharyngeal erythema or tonsillar abscesses. No tonsillar exudate.  Eyes: Conjunctivae are normal.  Neck: Normal range of motion. Neck supple.  Cardiovascular: Normal rate and intact distal pulses.   Pulmonary/Chest: Effort normal. No respiratory distress.  Abdominal: Soft. He exhibits no distension and no mass. There is no tenderness. There is no rebound and no guarding. No hernia. Hernia confirmed negative in the right inguinal area and confirmed negative in the left inguinal area.  Genitourinary: Testes normal and penis normal. Right testis shows no mass, no swelling and no tenderness. Left testis shows no mass, no swelling and no tenderness. Uncircumcised. No phimosis, paraphimosis, hypospadias, penile erythema or penile tenderness. No discharge found.  Genitourinary Comments: No rash or lesions present  Lymphadenopathy: No inguinal adenopathy noted on the right or left side.  Neurological: He is alert.  Skin: Skin is warm and dry.  Psychiatric: He has a normal mood and affect.  Nursing note and vitals reviewed.    ED Treatments / Results  DIAGNOSTIC STUDIES:  Oxygen Saturation is 100% on RA,  normal by my interpretation.    COORDINATION OF CARE:  5:27 PM Discussed treatment plan with pt at bedside and pt agreed to plan.   Labs (all labs ordered are listed, but only abnormal results are displayed) Labs Reviewed  URINALYSIS, ROUTINE W REFLEX MICROSCOPIC  RPR  HIV ANTIBODY (ROUTINE TESTING)  GC/CHLAMYDIA PROBE AMP  ([place]) NOT [facility]    EKG  EKG Interpretation None       Radiology No results found.  Procedures Procedures (including critical care time)  Medications Ordered in ED Medications  lidocaine (XYLOCAINE) 2 % (with pres) injection (not administered)  azithromycin (ZITHROMAX) tablet 1,000 mg (1,000 mg Oral Given 12/10/16 1742)  cefTRIAXone (ROCEPHIN) injection 250 mg (250 mg Intramuscular Given 12/10/16 1743)     Initial Impression / Assessment and Plan / ED Course  I have reviewed the triage vital signs and the nursing notes.  Pertinent labs & imaging results that were available during my care of the patient were reviewed by me and considered in my medical decision making (see chart for details).  Clinical Course     Patient is afebrile without abdominal tenderness, abdominal pain or painful bowel movements to indicate prostatitis.  No tenderness to palpation of the testes or epididymis to suggest orchitis or epididymitis.  STD cultures obtained including HIV, syphilis, gonorrhea and chlamydia. Patient to be discharged with instructions to follow up with PCP. Discussed importance of using protection when sexually active. Pt understands that they have GC/Chlamydia cultures pending and that they will need to inform all sexual partners if results return positive. Patient has been treated prophylactically with azithromycin and Rocephin.      Final Clinical Impressions(s) / ED Diagnoses   Final diagnoses:  STD (male)    New Prescriptions New Prescriptions   No medications on file   I personally performed the services described in this documentation, which was scribed in my presence. The recorded information has been reviewed and is accurate.     Scott Sarkicole Elizabeth CloverdaleNadeau, [place]-C 12/10/16 1812    Scott NickAnthony Allen, MD 12/10/16 [phone]

## 2016-12-10 NOTE — ED Notes (Signed)
ED Provider at bedside. 

## 2016-12-10 NOTE — Discharge Instructions (Signed)
1. Medications: usual home medications °2. Treatment: rest, drink plenty of fluids, use a condom with every sexual encounter °3. Follow Up: Please followup with your primary doctor in 3 days for discussion of your diagnoses and further evaluation after today's visit; if you do not have a primary care doctor use the resource guide provided to find one; Please return to the ER for worsening symptoms, high fevers or persistent vomiting. ° °You have been tested for HIV, syphilis, chlamydia and gonorrhea.  These results will be available in approximately 3 days.  Please inform all sexual partners if you test positive for any of these diseases.  ° °[facility] [place] [facility] tests for every STD. There are 9 other clinics in [place], but only 3 of them offer such comprehensive testing. ° °Services Offered: °Testing Services  °Chlamydia Testing °Conventional Blood HIV Testing °Gonorrhea Testing °Hepatitis B Testing °Hepatitis C Testing °Herpes Testing °Syphilis Testing °TB Testing ° °Vaccines And Treatments:  °Hepatitis B Vaccine °Hepatitis A Vaccine °HPV Vaccine °TB Treatment °Gynecological Care °Family Planning ° °Prevention Services:  °HIV Test Counseling °HIV/AIDS Prevention Education °Safer Sex Education °Speakers [place] °STD Prevention/Education °TB Prevention Education ° °Languages Spoken: °English °Spanish ° °Hours of Operation: °Note: Please contact the organization for hours of operation. Disclaimer: Hours of peration change frequently. Please contact the organization to verify. °Appointment Required: Yes ° °Contact Information: °Phone [phone] °Other Phones [phone] (Domestic Fax) ° °Address: °[address] °[place], [place] [address] ° °Website: [email or web address]  °

## 2016-12-10 NOTE — ED Notes (Signed)
Patient was alert, oriented and stable upon discharge. RN went over AVS and patient had no further questions.  

## 2016-12-11 LAB — GC/CHLAMYDIA PROBE AMP (~~LOC~~) NOT AT ARMC
Chlamydia: NEGATIVE
NEISSERIA GONORRHEA: NEGATIVE

## 2016-12-12 LAB — RPR: RPR Ser Ql: NONREACTIVE

## 2016-12-12 LAB — HIV ANTIBODY (ROUTINE TESTING W REFLEX): HIV Screen 4th Generation wRfx: NONREACTIVE

## 2017-09-19 ENCOUNTER — Emergency Department (HOSPITAL_COMMUNITY)
Admission: EM | Admit: 2017-09-19 | Discharge: 2017-09-19 | Disposition: A | Discharge status: Home / Self Care | Payer: Self-pay | Attending: Emergency Medicine | Admitting: Emergency Medicine

## 2017-09-19 ENCOUNTER — Encounter (HOSPITAL_COMMUNITY): Payer: Self-pay

## 2017-09-19 DIAGNOSIS — L259 Unspecified contact dermatitis, unspecified cause: Secondary | ICD-10-CM | POA: Insufficient documentation

## 2017-09-19 MED ORDER — HYDROCORTISONE 1 % EX CREA: 1.0000 "application " | TOPICAL_CREAM | Freq: Two times a day (BID) | CUTANEOUS | 0 refills | Status: AC

## 2017-09-19 NOTE — ED Notes (Signed)
Pt ambulatory and independent at discharge.  Verbalized understanding of discharge instructions 

## 2017-09-19 NOTE — ED Provider Notes (Signed)
[facility]-EMERGENCY DEPT Provider Note   CSN: [id number] Arrival date & time: 09/19/17  1441     History   Chief Complaint Chief Complaint  Patient presents with  . Rash    HPI Scott Escobar is a 31 y.o. male.  HPI  Patient with no pertinent past medical history, presents complaining of rash in right axilla, that started on 10/12. Patient reports rash started as a small red irritated area in his armpit, but reports he's been scratching it and the rash has worsened, and more diffusely covers the axilla. Patient denies any rash elsewhere, no rash in the left axilla. He reports he has used over-the-counter antibiotic creams with no relief. Patient does report that he recently tried a new brand of deodorant, denies any other changes in household products. No previous history of allergic reactions  History reviewed. No pertinent past medical history.  There are no active problems to display for this patient.   History reviewed. No pertinent surgical history.     Home Medications    Prior to Admission medications   Medication Sig Start Date End Date Taking? Authorizing Provider  hydrocortisone cream 1 % Apply 1 application topically 2 (two) times daily. 09/19/17   Dartha LodgeFord, Dorlis Judice N, PA-C    Family History History reviewed. No pertinent family history.  Social History Social History  Substance Use Topics  . Smoking status: Never Smoker  . Smokeless tobacco: Never Used  . Alcohol use No     Allergies   Patient has no known allergies.   Review of Systems Review of Systems  Constitutional: Negative for chills and fever.  Skin: Positive for rash.     Physical Exam Updated Vital Signs BP 125/87 (BP Location: Right Arm)   Pulse 75   Temp 97.8 F (36.6 C) (Oral)   Resp 16   Ht 5\' 4"  (1.626 m)   Wt 59.1 kg (130 lb 6 oz)   SpO2 98%   BMI 22.38 kg/m   Physical Exam  Constitutional: He appears well-developed and well-nourished. No distress.    HENT:  Head: Normocephalic and atraumatic.  Mouth/Throat: Oropharynx is clear and moist.  Eyes: Right eye exhibits no discharge. Left eye exhibits no discharge.  Pulmonary/Chest: Effort normal. No stridor. No respiratory distress. He has no wheezes. He has no rales.  Neurological: He is alert. Coordination normal.  Skin: Skin is warm and dry. Capillary refill takes less than 2 seconds. He is not diaphoretic.  Right axilla is erythematous and inflamed, with small vesicles, no pustules, petechiae or purpura  Psychiatric: He has a normal mood and affect. His behavior is normal.  Nursing note and vitals reviewed.    ED Treatments / Results  Labs (all labs ordered are listed, but only abnormal results are displayed) Labs Reviewed - No data to display  EKG  EKG Interpretation None       Radiology No results found.  Procedures Procedures (including critical care time)  Medications Ordered in ED Medications - No data to display   Initial Impression / Assessment and Plan / ED Course  I have reviewed the triage vital signs and the nursing notes.  Pertinent labs & imaging results that were available during my care of the patient were reviewed by me and considered in my medical decision making (see chart for details).  Rash consistent with irritant contact dermatitis. Patient denies any difficulty breathing or swallowing.  Pt has a patent airway without stridor and is handling secretions without difficulty;  no angioedema. No blisters, no pustules, no warmth, no draining sinus tracts, no superficial abscesses, no bullous impetigo, no vesicles, no desquamation, no target lesions with dusky purpura or a central bulla. Not tender to touch. No concern for superimposed infection. No concern for SJS, TEN, TSS, tick borne illness, syphilis or other life-threatening condition. Will discharge home with short course of steroid cream, Benadryl as needed for pruritis.    Final Clinical  Impressions(s) / ED Diagnoses   Final diagnoses:  Contact dermatitis, unspecified contact dermatitis type, unspecified trigger    New Prescriptions New Prescriptions   HYDROCORTISONE CREAM 1 %    Apply 1 application topically 2 (two) times daily.     Dartha Lodge, PA-C 09/19/17 2253    Lavera Guise, MD 09/19/17 [phone]

## 2017-09-19 NOTE — ED Triage Notes (Signed)
Patient states he has had a rash since 09/06/17 and ws a small are. Patient states when he scratched the area , the rash worsened. Patient states he has used OTC antibiotic creams with no relief.

## 2019-07-22 ENCOUNTER — Other Ambulatory Visit: Payer: Self-pay

## 2019-07-22 DIAGNOSIS — Z20822 Contact with and (suspected) exposure to covid-19: Secondary | ICD-10-CM

## 2024-01-01 ENCOUNTER — Ambulatory Visit
Admission: EM | Admit: 2024-01-01 | Discharge: 2024-01-01 | Disposition: A | Discharge status: Home / Self Care | Payer: Self-pay | Attending: Internal Medicine | Admitting: Internal Medicine

## 2024-01-01 ENCOUNTER — Encounter: Payer: Self-pay | Admitting: Emergency Medicine

## 2024-01-01 DIAGNOSIS — Z23 Encounter for immunization: Secondary | ICD-10-CM

## 2024-01-01 DIAGNOSIS — S70312A Abrasion, left thigh, initial encounter: Secondary | ICD-10-CM

## 2024-01-01 DIAGNOSIS — W548XXA Other contact with dog, initial encounter: Secondary | ICD-10-CM

## 2024-01-01 MED ORDER — TETANUS-DIPHTH-ACELL PERTUSSIS 5-2.5-18.5 LF-MCG/0.5 IM SUSY
0.5000 mL | PREFILLED_SYRINGE | Freq: Once | INTRAMUSCULAR | Status: AC
  Administered 2024-01-01: 0.5 mL via INTRAMUSCULAR

## 2024-01-01 NOTE — ED Triage Notes (Signed)
 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 Bite look superficial.  Dog is not up to date on shot.  Pt does not know last tetanus shot

## 2024-01-01 NOTE — Discharge Instructions (Signed)
 Your dog scratch did not break your pants and I have low suspicion that you were exposed to rabies.  Watch for signs of infection to the scratch such as redness, swelling, drainage, and worsening pain.  If you develop any new or worsening symptoms or if your symptoms do not start to improve, please return here or follow-up with your primary care provider. If your symptoms are severe, please go to the emergency room.

## 2024-01-01 NOTE — ED Provider Notes (Signed)
 [facility]    CSN: [id number] Arrival date & time: 01/01/24  1703      History   Chief Complaint Chief Complaint  Patient presents with   Animal Bite    HPI Scott Escobar is a 38 y.o. male.   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  He states the dog is now contained and in quarantine.  He was wearing thick sweatpants at the time of injury and his sweatpants remain intact and without break/rips to the fabric.  He has an approximately 6 cm scratch to the left thigh that broke the skin at the distal portion of the scratch.  He does not believe that there was any bleeding from the site.  He is unsure of the date of his last tetanus injection.     History reviewed. No pertinent past medical history.  There are no active problems to display for this patient.   History reviewed. No pertinent surgical history.     Home Medications    Prior to Admission medications   Medication Sig Start Date End Date Taking? Authorizing Provider  hydrocortisone  cream 1 % Apply 1 application topically 2 (two) times daily. 09/19/17   Alva Larraine FALCON, PA-C    Family History History reviewed. No pertinent family history.  Social History Social History   Tobacco Use   Smoking status: Never   Smokeless tobacco: Never  Vaping Use   Vaping status: Never Used  Substance Use Topics   Alcohol use: No   Drug use: No     Allergies   Patient has no known allergies.   Review of Systems Review of Systems Per HPI  Physical Exam Triage Vital Signs ED Triage Vitals  Encounter Vitals Group     BP 01/01/24 1753 123/87     Systolic BP Percentile --      Diastolic BP Percentile --      Pulse Rate 01/01/24 1753 95     Resp  01/01/24 1753 20     Temp 01/01/24 1753 97.9 F (36.6 C)     Temp Source 01/01/24 1753 Oral     SpO2 01/01/24 1753 96 %     Weight --      Height --      Head Circumference --      Peak Flow --      Pain Score 01/01/24 1756 0     Pain Loc --      Pain Education --      Exclude from Growth Chart --    No data found.  Updated Vital Signs BP 123/87 (BP Location: Right Arm)   Pulse 95   Temp 97.9 F (36.6 C) (Oral)   Resp 20   SpO2 96%   Visual Acuity Right Eye Distance:   Left Eye Distance:   Bilateral Distance:    Right Eye Near:   Left Eye Near:    Bilateral Near:     Physical Exam Vitals and nursing note reviewed.  Constitutional:      Appearance: He is not ill-appearing or toxic-appearing.  HENT:     Head: Normocephalic and atraumatic.     Right Ear: Hearing and external ear normal.  Left Ear: Hearing and external ear normal.     Nose: Nose normal.     Mouth/Throat:     Lips: Pink.  Eyes:     General: Lids are normal. Vision grossly intact. Gaze aligned appropriately.     Extraocular Movements: Extraocular movements intact.     Conjunctiva/sclera: Conjunctivae normal.  Pulmonary:     Effort: Pulmonary effort is normal.  Musculoskeletal:     Cervical back: Neck supple.  Skin:    General: Skin is warm and dry.     Capillary Refill: Capillary refill takes less than 2 seconds.     Findings: Abrasion present. No rash.     Comments: Linear abrasion to the left thigh as seen in image below. Sweatpants are entirely intact and without breaks in the fabric.  Neurological:     General: No focal deficit present.     Mental Status: He is alert and oriented to person, place, and time. Mental status is at baseline.     Cranial Nerves: No dysarthria or facial asymmetry.  Psychiatric:        Mood and Affect: Mood normal.        Speech: Speech normal.        Behavior: Behavior normal.        Thought Content: Thought content normal.        Judgment: Judgment  normal.      UC Treatments / Results  Labs (all labs ordered are listed, but only abnormal results are displayed) Labs Reviewed - No data to display  EKG   Radiology No results found.  Procedures Procedures (including critical care time)  Medications Ordered in UC Medications  Tdap (BOOSTRIX) injection 0.5 mL (has no administration in time range)    Initial Impression / Assessment and Plan / UC Course  I have reviewed the triage vital signs and the nursing notes.  Pertinent labs & imaging results that were available during my care of the patient were reviewed by me and considered in my medical decision making (see chart for details).   1.  Dog scratch, need for tetanus booster Low suspicion for rabies exposure given sweatpants did not break and the dog's paw/nail never made contact with patient's skin directly.  He was scratched underneath the sweatpants. Discussed rabies vaccine indications with patient at length.  He is agreeable to proceed without receiving Kedrab rabies series given his sweatpants protected him well from contact with the animals nail/Paw. Discussed animal may be quarantined for 10 days and observed for signs of abnormal behavior anyway which will provide further protection for patient in terms of rabies exposure. Tetanus injection updated today. Infection return precautions discussed.  Counseled patient on potential for adverse effects with medications prescribed/recommended today, strict ER and return-to-clinic precautions discussed, patient verbalized understanding.    Final Clinical Impressions(s) / UC Diagnoses   Final diagnoses:  Dog scratch     Discharge Instructions      Your dog scratch did not break your pants and I have low suspicion that you were exposed to rabies.  Watch for signs of infection to the scratch such as redness, swelling, drainage, and worsening pain.  If you develop any new or worsening symptoms or if your symptoms do  not start to improve, please return here or follow-up with your primary care provider. If your symptoms are severe, please go to the emergency room.     ED Prescriptions   None    PDMP not reviewed this encounter.  Enedelia Dorna HERO, [place] 01/01/24 1904
